# Patient Record
Sex: Male | Born: 1995 | Hispanic: Yes | Marital: Single | Smoking: Never smoker
Health system: Southern US, Community
[De-identification: ages and names within clinical notes are randomized; demographics above are authoritative.]

---
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
## 2019-12-25 ENCOUNTER — Emergency Department (HOSPITAL_COMMUNITY): Payer: Self-pay

## 2019-12-25 ENCOUNTER — Encounter (HOSPITAL_COMMUNITY): Payer: Self-pay | Admitting: *Deleted

## 2019-12-25 ENCOUNTER — Other Ambulatory Visit: Payer: Self-pay

## 2019-12-25 ENCOUNTER — Emergency Department (HOSPITAL_COMMUNITY)
Admission: EM | Admit: 2019-12-25 | Discharge: 2019-12-25 | Disposition: A | Discharge status: Home / Self Care | Payer: Self-pay | Attending: Emergency Medicine | Admitting: Emergency Medicine

## 2019-12-25 DIAGNOSIS — M25532 Pain in left wrist: Secondary | ICD-10-CM | POA: Insufficient documentation

## 2019-12-25 DIAGNOSIS — M25542 Pain in joints of left hand: Secondary | ICD-10-CM | POA: Insufficient documentation

## 2019-12-25 DIAGNOSIS — M79641 Pain in right hand: Secondary | ICD-10-CM

## 2019-12-25 DIAGNOSIS — Y9389 Activity, other specified: Secondary | ICD-10-CM | POA: Insufficient documentation

## 2019-12-25 DIAGNOSIS — Y9289 Other specified places as the place of occurrence of the external cause: Secondary | ICD-10-CM | POA: Insufficient documentation

## 2019-12-25 DIAGNOSIS — M25541 Pain in joints of right hand: Secondary | ICD-10-CM | POA: Insufficient documentation

## 2019-12-25 DIAGNOSIS — M25531 Pain in right wrist: Secondary | ICD-10-CM | POA: Insufficient documentation

## 2019-12-25 DIAGNOSIS — W228XXA Striking against or struck by other objects, initial encounter: Secondary | ICD-10-CM | POA: Insufficient documentation

## 2019-12-25 DIAGNOSIS — Y999 Unspecified external cause status: Secondary | ICD-10-CM | POA: Insufficient documentation

## 2019-12-25 MED ORDER — NAPROXEN 500 MG PO TABS: 500.0000 mg | ORAL_TABLET | Freq: Two times a day (BID) | ORAL | 0 refills | Status: DC | PRN

## 2019-12-25 MED ORDER — NAPROXEN 250 MG PO TABS
500.0000 mg | ORAL_TABLET | Freq: Once | ORAL | Status: AC
  Administered 2019-12-25: 500 mg via ORAL
  Filled 2019-12-25: qty 2

## 2019-12-25 MED ORDER — ACETAMINOPHEN 325 MG PO TABS
650.0000 mg | ORAL_TABLET | Freq: Once | ORAL | Status: DC
  Filled 2019-12-25: qty 2

## 2019-12-25 NOTE — Discharge Instructions (Signed)
Please read and follow all provided instructions.  You have been seen today for pain to your wrists and hands on both sides.  Tests performed today include: An x-ray of the affected areas - does NOT show any broken bones or dislocations.  Vital signs. See below for your results today.   Home care instructions: -- *PRICE in the first 24-48 hours after injury: Protect (with brace, splint, sling), if given by your provider Rest Ice- Do not apply ice pack directly to your skin, place towel or similar between your skin and ice/ice pack. Apply ice for 20 min, then remove for 40 min while awake Compression- Wear brace, elastic bandage, splint as directed by your provider Elevate affected extremity above the level of your heart when not walking around for the first 24-48 hours   Please wear the brace on your right hand at all times due to concern that you may have an injury to a bone that was not fracture does not wish upon initial imaging.  Please wear the brace until you have followed up with orthopedics.  Medications:  - Naproxen is a nonsteroidal anti-inflammatory medication that will help with pain and swelling. Be sure to take this medication as prescribed with food, 1 pill every 12 hours,  It should be taken with food, as it can cause stomach upset, and more seriously, stomach bleeding. Do not take other nonsteroidal anti-inflammatory medications with this such as Advil, Motrin, Aleve, Mobic, Goodie Powder, or Motrin.    You make take Tylenol per over the counter dosing with these medications.   We have prescribed you new medication(s) today. Discuss the medications prescribed today with your pharmacist as they can have adverse effects and interactions with your other medicines including over the counter and prescribed medications. Seek medical evaluation if you start to experience new or abnormal symptoms after taking one of these medicines, seek care immediately if you start to experience  difficulty breathing, feeling of your throat closing, facial swelling, or rash as these could be indications of a more serious allergic reaction   Follow-up instructions: Please follow-up with orthopedic surgery within 3 days.  Return instructions:  Please return if your digits or extremity are numb or tingling, appear gray or blue, or you have severe pain (also elevate the extremity and loosen splint or wrap if you were given one) Please return if you have redness or fevers.  Please return to the Emergency Department if you experience worsening symptoms.  Please return if you have any other emergent concerns. Additional Information:  Your vital signs today were: BP 116/71 (BP Location: Left Arm)   Pulse 61   Temp 97.6 F (36.4 C) (Oral)   Resp 14   Ht 5\' 7"  (1.702 m)   Wt 72.6 kg   SpO2 100%   BMI 25.06 kg/m  If your blood pressure (BP) was elevated above 135/85 this visit, please have this repeated by your doctor within one month. ---------------

## 2019-12-25 NOTE — ED Triage Notes (Addendum)
Pt with right wrist pain worse than the left wrist for about a week.  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

## 2019-12-25 NOTE — ED Notes (Signed)
Discharge vitals not obtained. Patient had to get back home to his child.

## 2019-12-25 NOTE — ED Provider Notes (Signed)
[facility] EMERGENCY DEPARTMENT Provider Note   CSN: [id number] Arrival date & time: 12/25/19  1621     History Chief Complaint  Patient presents with  . Wrist Pain    Dakota Lynn is a 24 y.o. male without significant past medical history who presents to the emergency department with complaints of bilateral wrist and hand pain for about 1 week.  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  He remains with some discomfort in the left wrist/hand as well.  Pain is worse with movement.  No alleviating factors.  Tried ibuprofen without much relief.  Denies numbness, tingling, weakness, redness, or open wounds.  He is right-hand dominant.  HPI     History reviewed. No pertinent past medical history.  There are no problems to display for this patient.   History reviewed. No pertinent surgical history.     History reviewed. No pertinent family history.  Social History   Tobacco Use  . Smoking status: Never Smoker  . Smokeless tobacco: Never Used  Substance Use Topics  . Alcohol use: Never  . Drug use: Never    Home Medications Prior to Admission medications   Not on File    Allergies    Patient has no known allergies.  Review of Systems   Review of Systems  Constitutional: Negative for chills and fever.  Respiratory: Negative for shortness of breath.   Cardiovascular: Negative for chest pain.  Gastrointestinal: Negative for abdominal pain and vomiting.  Musculoskeletal: Positive for arthralgias.  Skin: Negative for color change and wound.  Neurological: Negative for weakness and numbness.    Physical Exam Updated Vital Signs BP 116/71 (BP Location: Left Arm)   Pulse 61   Temp 97.6 F (36.4 C) (Oral)   Resp 14   Ht 5\' 7"  (1.702 m)   Wt 72.6 kg   SpO2 100%   BMI 25.06 kg/m   Physical Exam Vitals and nursing note reviewed.    Constitutional:      General: He is not in acute distress.    Appearance: Normal appearance. He is not ill-appearing or toxic-appearing.  HENT:     Head: Normocephalic and atraumatic.  Neck:     Comments: No midline tenderness.  Cardiovascular:     Rate and Rhythm: Normal rate.     Pulses:          Radial pulses are 2+ on the right side and 2+ on the left side.  Pulmonary:     Effort: No respiratory distress.     Breath sounds: Normal breath sounds.  Musculoskeletal:     Cervical back: Normal range of motion and neck supple.     Comments: Upper extremities: No obvious deformity, appreciable swelling, edema, erythema, ecchymosis, warmth, or open wounds. Patient has intact AROM throughout. RUE: Patient is tender to the diffuse R wrist more so radially extending to the 1st MCP, does have some anatomical snuffbox tenderness but this is not primary area of tenderness. Pain with finkelstein maneuver. Otherwise nontender. LUE: Tender to the radial wrist, otherwise no significant tenderness to palpation. Pain with finkelstein maneuver.   Skin:    General: Skin is warm and dry.     Capillary Refill: Capillary refill takes less than 2 seconds.  Neurological:     Mental Status: He is alert.     Comments: Alert.  Clear speech. Sensation grossly intact to bilateral upper extremities. 5/5 symmetric grip strength. Ambulatory. Able to perform OK sign, thumbs up and cross 2nd/3rd digits bilaterally.   Psychiatric:        Mood and Affect: Mood normal.        Behavior: Behavior normal.    ED Results / Procedures / Treatments   Labs (all labs ordered are listed, but only abnormal results are displayed) Labs Reviewed - No data to display  EKG None  Radiology DG Hand Complete Left  Result Date: 12/25/2019 CLINICAL DATA:  Bilateral hand pain. EXAM: LEFT HAND - COMPLETE 3+ VIEW COMPARISON:  None. FINDINGS: There is no evidence of fracture or dislocation. There is no evidence of arthropathy or other  focal bone abnormality. Soft tissues are unremarkable. IMPRESSION: Negative. Electronically Signed   By: Aram Candela M.D.   On: 12/25/2019 19:14   DG Hand Complete Right  Result Date: 12/25/2019 CLINICAL DATA:  Bilateral hand pain. EXAM: RIGHT HAND - COMPLETE 3+ VIEW COMPARISON:  None. FINDINGS: There is no evidence of fracture or dislocation. There is no evidence of arthropathy or other focal bone abnormality. Soft tissues are unremarkable. IMPRESSION: Negative. Electronically Signed   By: Aram Candela M.D.   On: 12/25/2019 19:14    Procedures Procedures (including critical care time)  Medications Ordered in ED Medications  acetaminophen (TYLENOL) tablet 650 mg (has no administration in time range)    ED Course  I have reviewed the triage vital signs and the nursing notes.  Pertinent labs & imaging results that were available during my care of the patient were reviewed by me and considered in my medical decision making (see chart for details).    MDM Rules/Calculators/A&P                         Patient presents to the emergency department with complaints of bilateral wrist/hand pain in the setting of working with his hands with worsening pain to the right wrist/hand after hitting it with a tile a few days ago.  Patient is nontoxic, resting comfortably, vitals within normal limits.  He is afebrile, there is no erythema, warmth, or open wounds, overall presentation does not seem consistent with septic joint, infectious flexor tenosynovitis, or cellulitis.  I ordered X-rays of the bilateral hands with extension to the wrist, no acute process identified, I have personally reviewed & interpreted imaging.  No fractures or dislocations.  He does have some anatomical snuffbox tenderness to the right upper extremity, however this does not seem to be his most focal area of tenderness, we will place in a thumb spica brace with instructions to maintain this until he is able to follow-up with  orthopedics for precautions.  He does have some discomfort with Finkelstein maneuver bilaterally, I suspect this is probably more of a tendinitis.  Will start on naproxen, discussed PRICE, and orthopedics follow-up. I discussed results, treatment plan, need for follow-up, and return precautions with the patient. Provided opportunity for questions, patient confirmed understanding and is in agreement with plan.   Final Clinical Impression(s) / ED Diagnoses Final diagnoses:  Pain in both wrists  Bilateral hand pain    Rx / DC Orders ED Discharge Orders         Ordered    naproxen (NAPROSYN) 500 MG tablet  2 times daily PRN     Discontinue  Reprint     12/25/19 1935  Desmond Lope 12/25/19 Ninfa Linden    Raeford Razor, MD 12/30/19 2043

## 2020-01-02 ENCOUNTER — Telehealth: Payer: Self-pay | Admitting: Orthopedic Surgery

## 2020-01-02 NOTE — Telephone Encounter (Signed)
This patient called the office late today (4:55) asking for an appointment for tomorrow before noon before he goes to [place], [place].  I told him that Dr Romeo Apple did not have any openings for tomorrow morning.  He said he has wrist pain and went to the ED.  He said he would just call us back in 2-3 weeks when he came back from [place] to schedule an appointment.

## 2021-05-03 IMAGING — DX DG HAND COMPLETE 3+V*R*
3 series · 3 of 3 positions shown · non-contrast
Comparison: None.

CLINICAL DATA: Bilateral hand pain.

EXAM:
RIGHT HAND - COMPLETE 3+ VIEW

[hand pa]
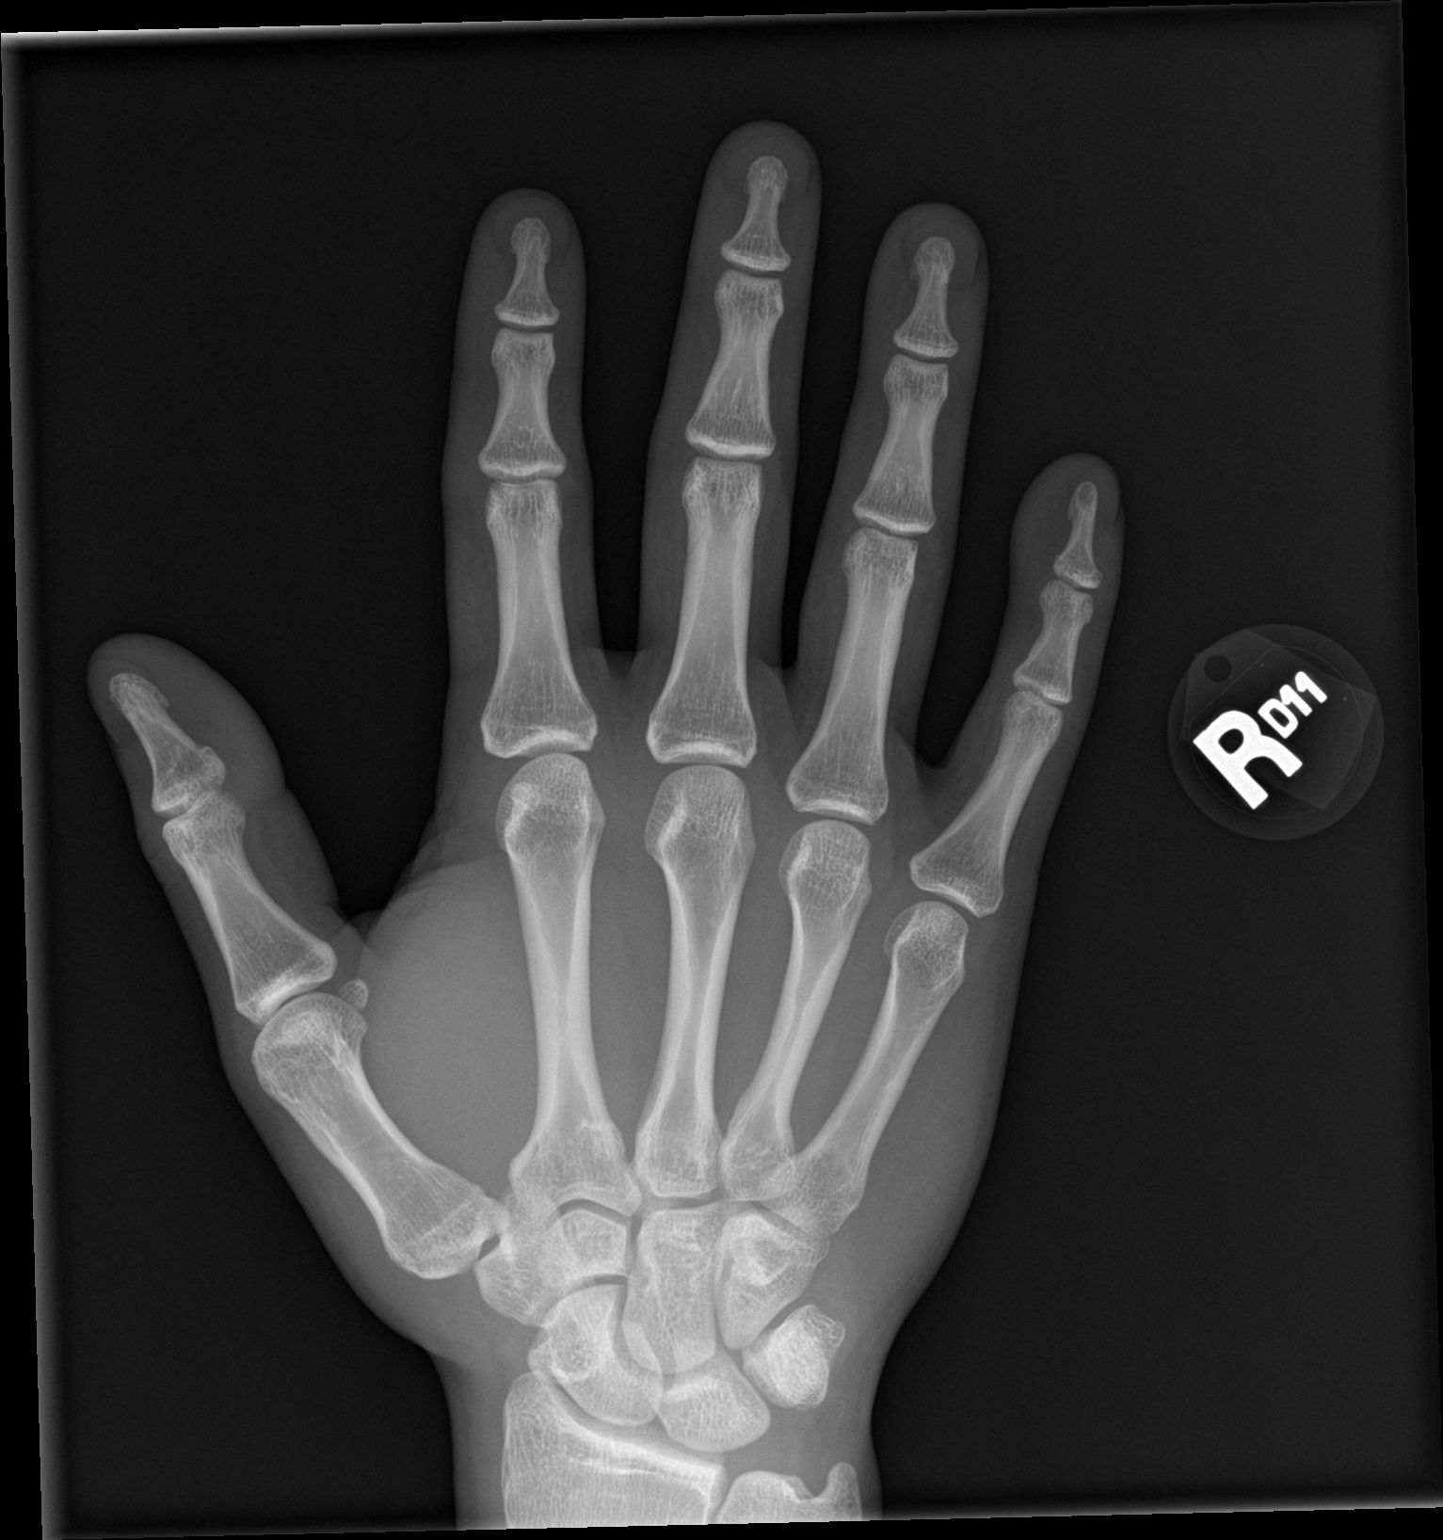

[hand obl]
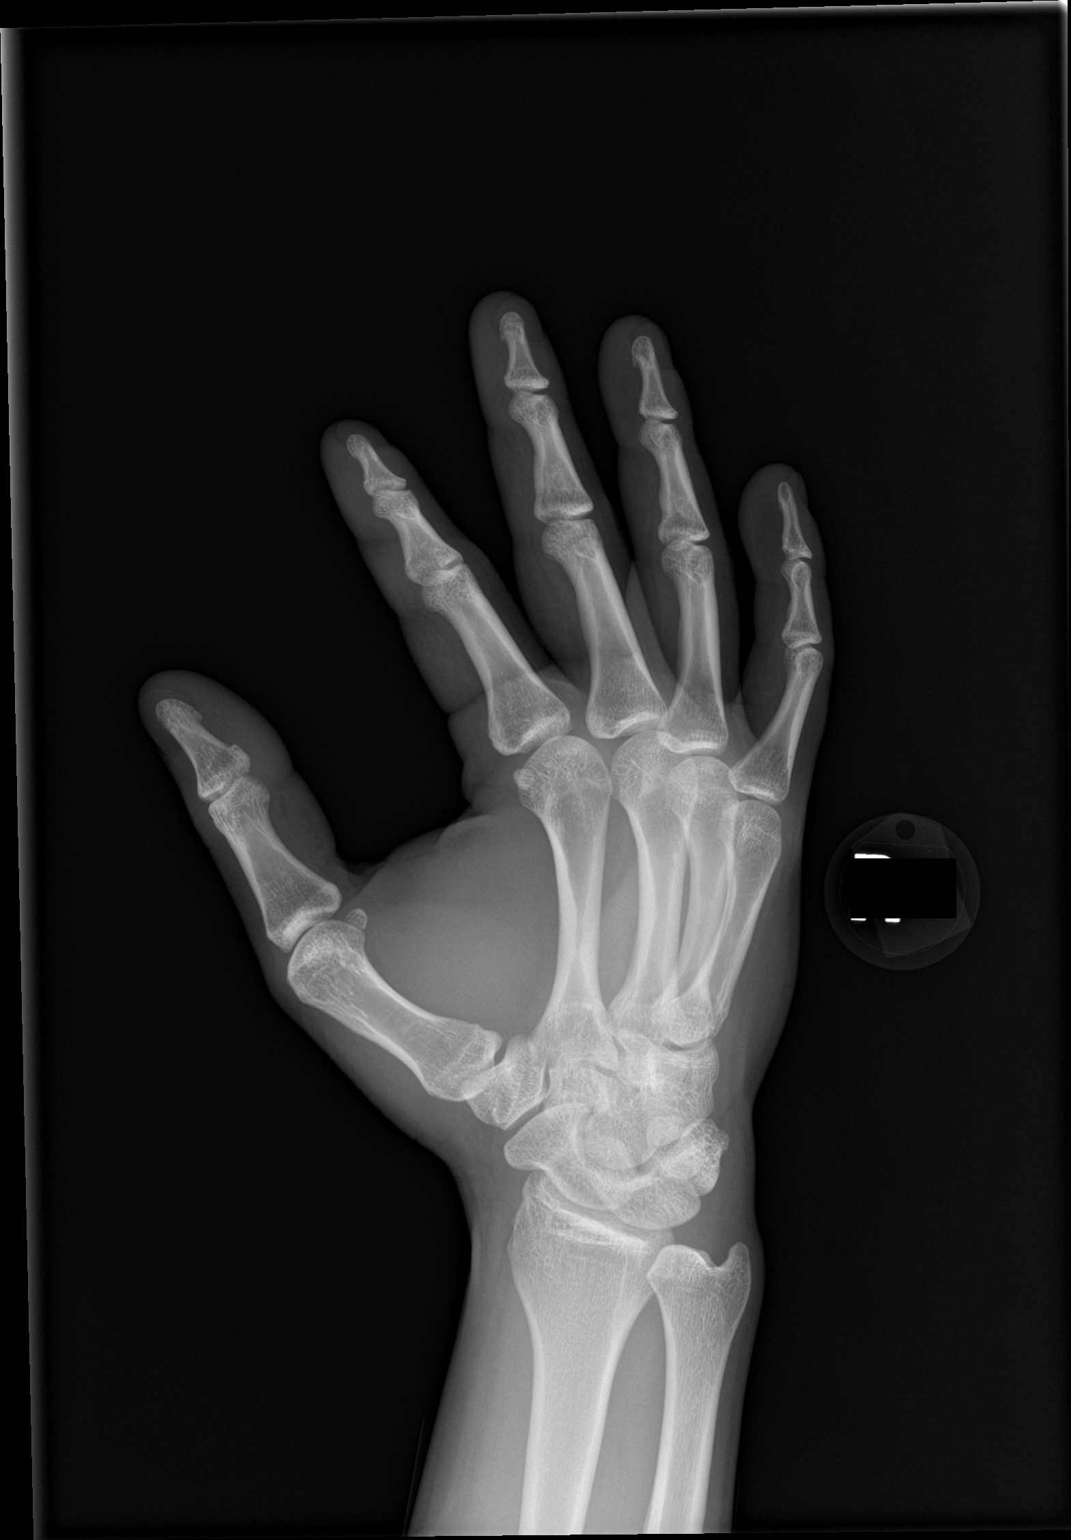

[hand lat]
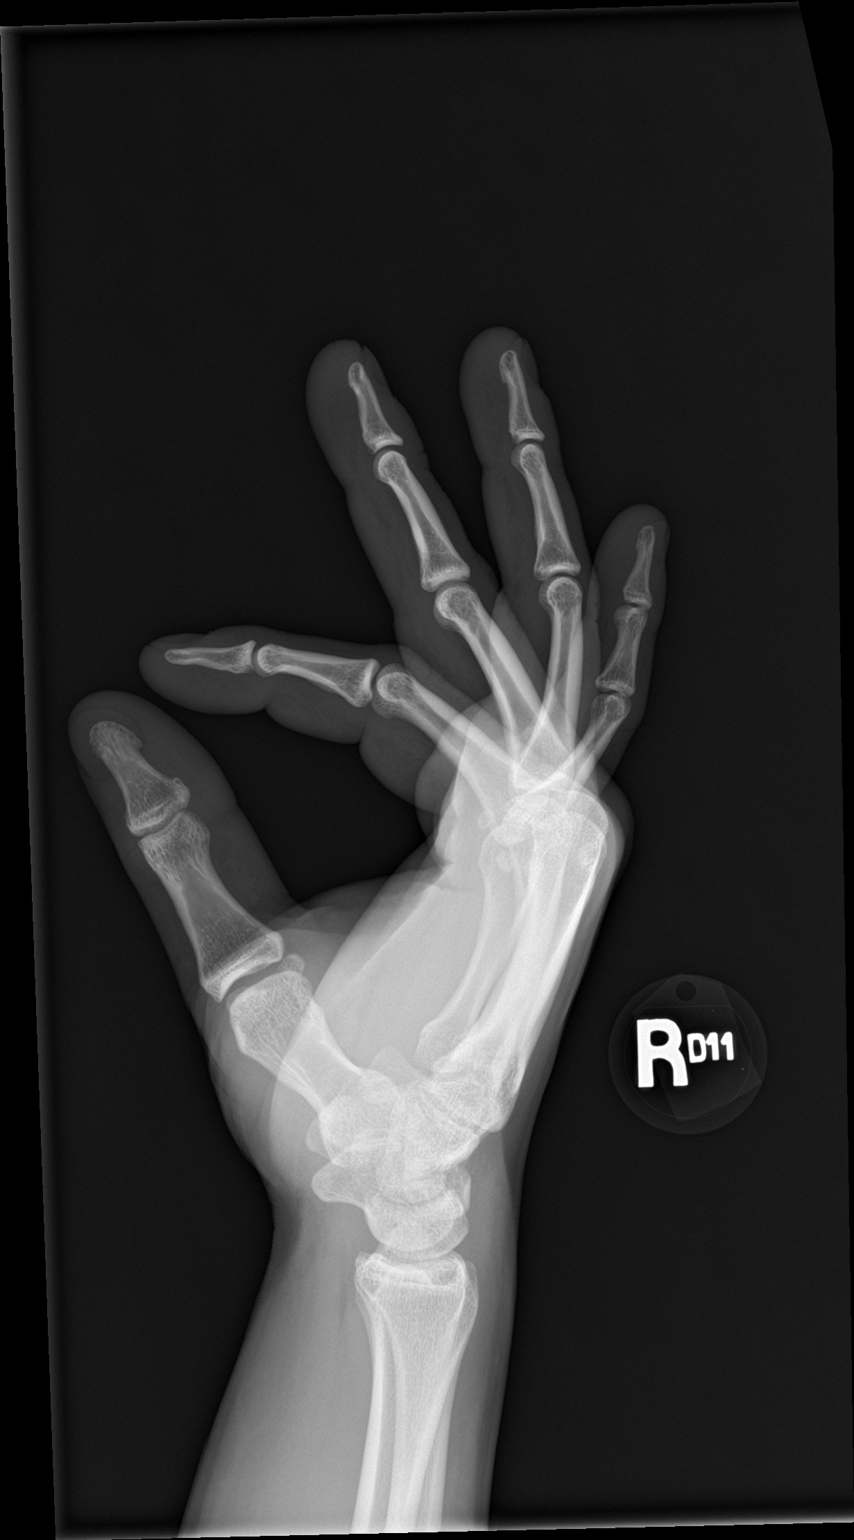

[3 of 3 positions shown; findings below may reference images not displayed]

FINDINGS: There is no evidence of fracture or dislocation. There is no
evidence of arthropathy or other focal bone abnormality. Soft
tissues are unremarkable.
IMPRESSION: Negative.

## 2021-05-03 IMAGING — DX DG HAND COMPLETE 3+V*L*
3 series · 3 of 3 positions shown · non-contrast
Comparison: None.

CLINICAL DATA: Bilateral hand pain.

EXAM:
LEFT HAND - COMPLETE 3+ VIEW

[hand pa]
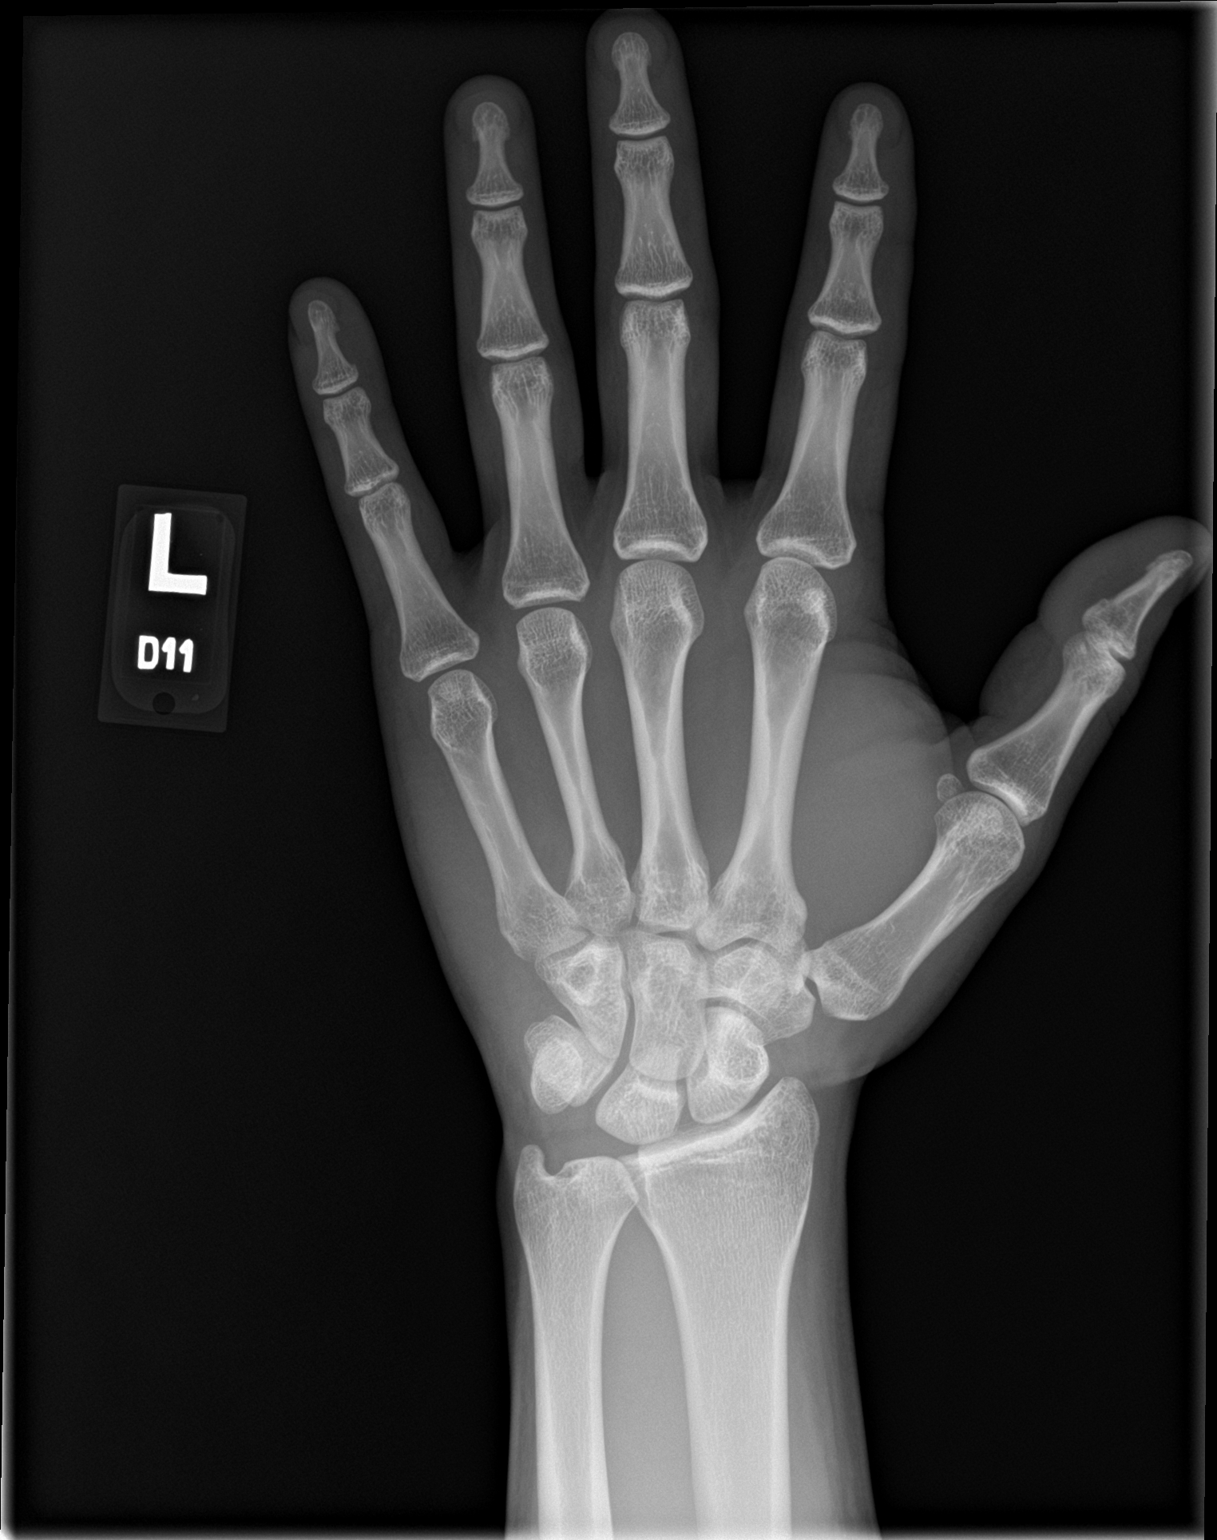

[hand obl]
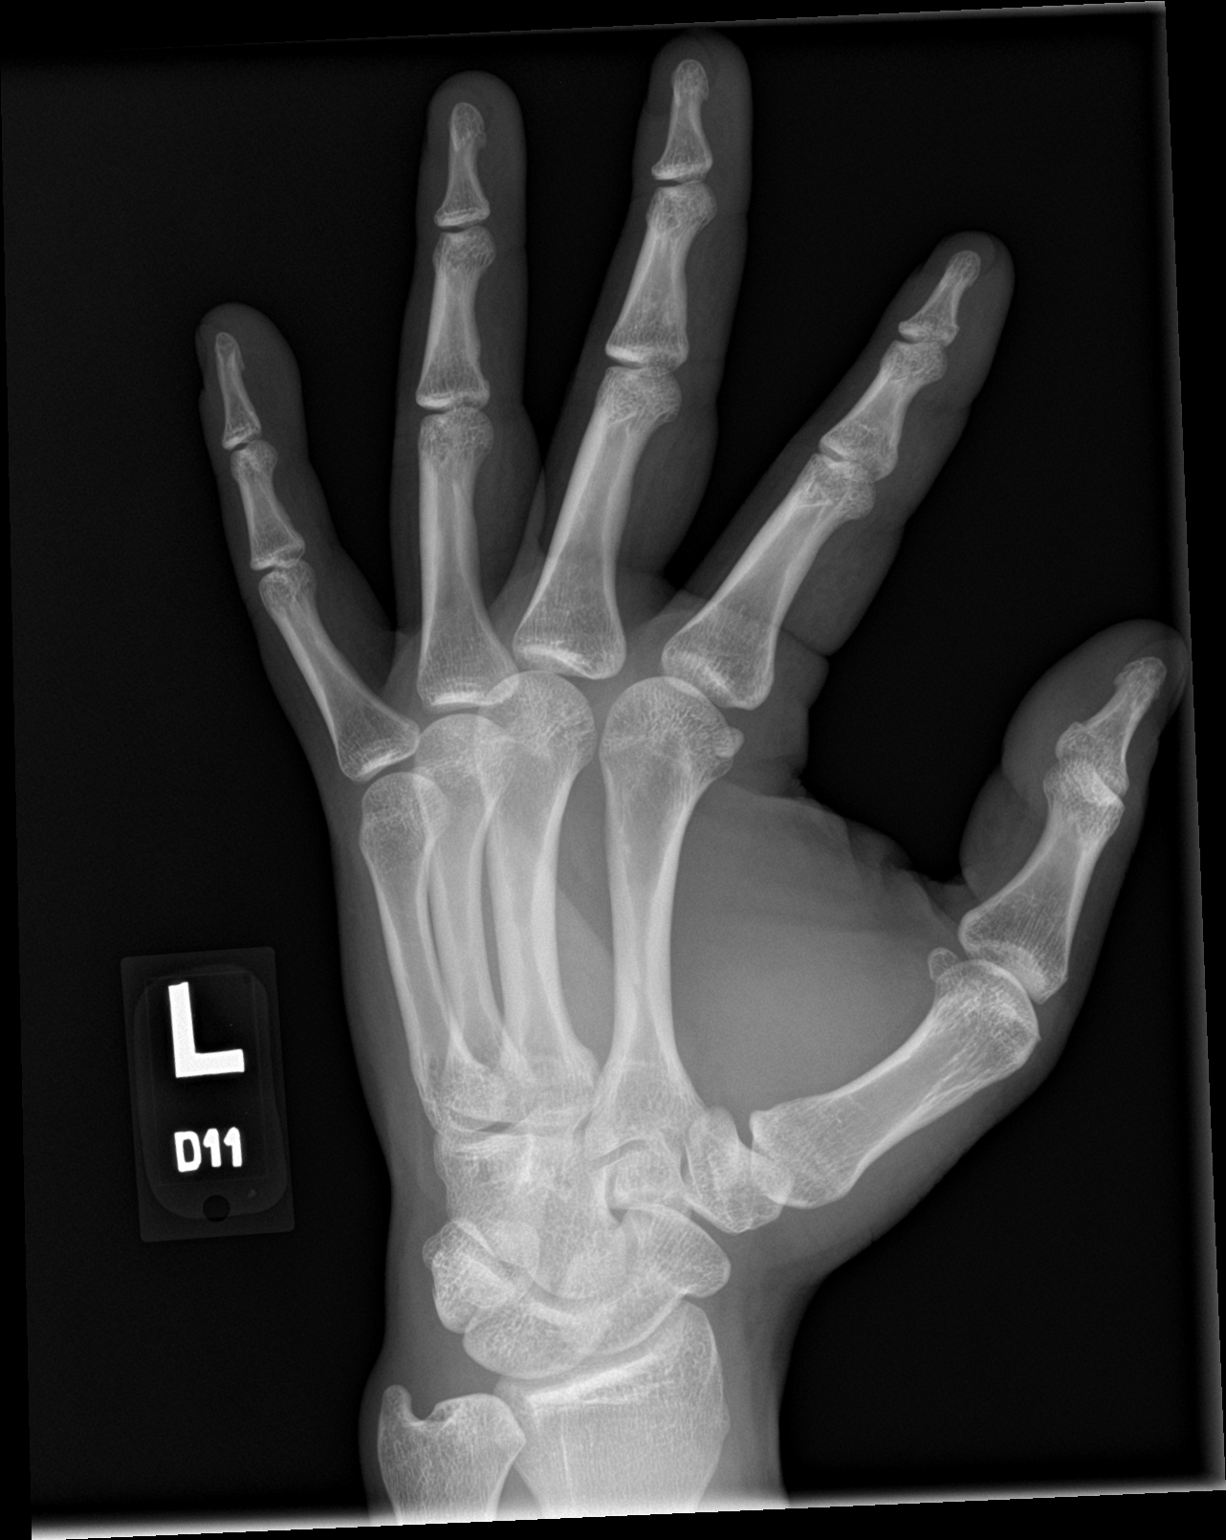

[hand lat]
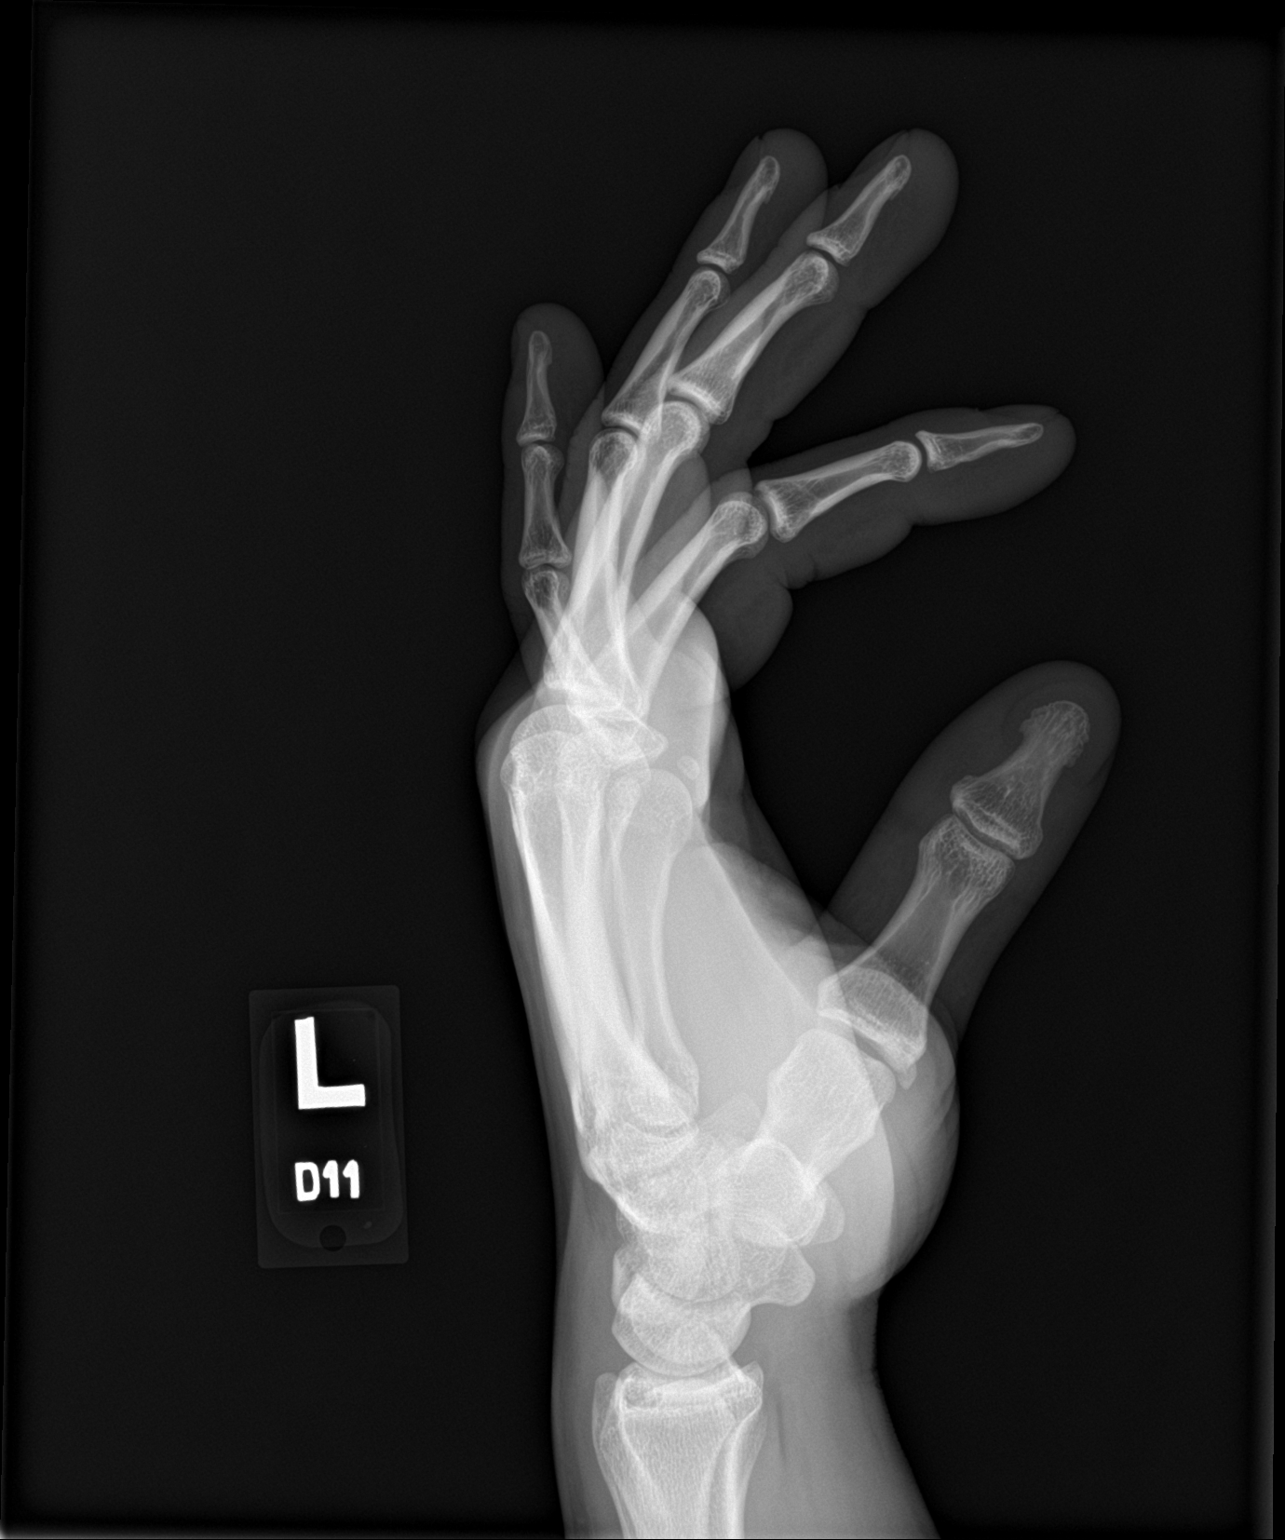

[3 of 3 positions shown; findings below may reference images not displayed]

FINDINGS: There is no evidence of fracture or dislocation. There is no
evidence of arthropathy or other focal bone abnormality. Soft
tissues are unremarkable.
IMPRESSION: Negative.

## 2022-01-13 ENCOUNTER — Encounter (HOSPITAL_BASED_OUTPATIENT_CLINIC_OR_DEPARTMENT_OTHER): Payer: Self-pay | Admitting: Emergency Medicine

## 2022-01-13 ENCOUNTER — Emergency Department (HOSPITAL_BASED_OUTPATIENT_CLINIC_OR_DEPARTMENT_OTHER): Payer: Self-pay

## 2022-01-13 ENCOUNTER — Emergency Department (HOSPITAL_BASED_OUTPATIENT_CLINIC_OR_DEPARTMENT_OTHER)
Admission: EM | Admit: 2022-01-13 | Discharge: 2022-01-14 | Disposition: A | Discharge status: Home / Self Care | Payer: Self-pay | Attending: Emergency Medicine | Admitting: Emergency Medicine

## 2022-01-13 ENCOUNTER — Other Ambulatory Visit: Payer: Self-pay

## 2022-01-13 DIAGNOSIS — Z23 Encounter for immunization: Secondary | ICD-10-CM | POA: Insufficient documentation

## 2022-01-13 DIAGNOSIS — M79642 Pain in left hand: Secondary | ICD-10-CM

## 2022-01-13 MED ORDER — TETANUS-DIPHTH-ACELL PERTUSSIS 5-2.5-18.5 LF-MCG/0.5 IM SUSY
0.5000 mL | PREFILLED_SYRINGE | Freq: Once | INTRAMUSCULAR | Status: AC
  Administered 2022-01-13: 0.5 mL via INTRAMUSCULAR
  Filled 2022-01-13: qty 0.5

## 2022-01-13 MED ORDER — DOXYCYCLINE HYCLATE 100 MG PO TABS
100.0000 mg | ORAL_TABLET | Freq: Once | ORAL | Status: AC
  Administered 2022-01-13: 100 mg via ORAL
  Filled 2022-01-13: qty 1

## 2022-01-13 MED ORDER — HYDROCODONE-ACETAMINOPHEN 5-325 MG PO TABS
1.0000 | ORAL_TABLET | Freq: Once | ORAL | Status: AC
  Administered 2022-01-13: 1 via ORAL
  Filled 2022-01-13: qty 1

## 2022-01-13 NOTE — ED Triage Notes (Signed)
While at work yesterday pt noticed blood coming from left hand but unsure what it was. Today has a bump and feels like there is something under skin where blood was coming from. Pt states when it happened he was working with wood and nail so it could be either. Pain is worse today and difficult to move hand.

## 2022-01-14 MED ORDER — HYDROCODONE-ACETAMINOPHEN 5-325 MG PO TABS: 1.0000 | ORAL_TABLET | Freq: Four times a day (QID) | ORAL | 0 refills | Status: DC | PRN

## 2022-01-14 MED ORDER — DOXYCYCLINE HYCLATE 100 MG PO CAPS: 100.0000 mg | ORAL_CAPSULE | Freq: Two times a day (BID) | ORAL | 0 refills | Status: DC

## 2022-01-14 NOTE — ED Provider Notes (Signed)
[facility] EMERGENCY DEPARTMENT Provider Note   CSN: [id number] Arrival date & time: 01/13/22  1953     History  Chief Complaint  Patient presents with   Hand Pain    Dakota Lynn is a 26 y.o. male.  Patient with an injury at work yesterday patient had a puncture wound to his right hand kind of over the index MP joint area.  It bled a lot.  He was concerned that maybe a piece of wood got in there a piece of metal because the wood had nails in it that he was removing and cutting through.  Patient states his hand was more swollen today.  Has good sensation to his fingers.  But has some pain with range of motion.  Tetanus is not up-to-date.  Patient is right-hand dominant  Past medical history noncontributory.  Patient not a smoker.       Home Medications Prior to Admission medications   Medication Sig Start Date End Date Taking? Authorizing Provider  doxycycline (VIBRAMYCIN) 100 MG capsule Take 1 capsule (100 mg total) by mouth 2 (two) times daily. 01/14/22  Yes Fredia Sorrow, MD  HYDROcodone-acetaminophen (NORCO/VICODIN) 5-325 MG tablet Take 1 tablet by mouth every 6 (six) hours as needed for moderate pain. 01/14/22  Yes Fredia Sorrow, MD  naproxen (NAPROSYN) 500 MG tablet Take 1 tablet (500 mg total) by mouth 2 (two) times daily as needed for moderate pain. 12/25/19   Petrucelli, Glynda Jaeger, PA-C      Allergies    Patient has no known allergies.    Review of Systems   Review of Systems  Constitutional:  Negative for chills and fever.  HENT:  Negative for ear pain and sore throat.   Eyes:  Negative for pain and visual disturbance.  Respiratory:  Negative for cough and shortness of breath.   Cardiovascular:  Negative for chest pain and palpitations.  Gastrointestinal:  Negative for abdominal pain and vomiting.  Genitourinary:  Negative for dysuria and hematuria.  Musculoskeletal:  Positive for joint swelling. Negative for arthralgias and back pain.  Skin:  Negative  for color change and rash.  Neurological:  Negative for seizures and syncope.  All other systems reviewed and are negative.   Physical Exam Updated Vital Signs BP 125/77 (BP Location: Right Arm)   Pulse 65   Temp 98.7 F (37.1 C) (Oral)   Resp 18   Ht 1.702 m (5\' 7" )   Wt 81.6 kg   SpO2 100%   BMI 28.19 kg/m  Physical Exam Vitals and nursing note reviewed.  Constitutional:      General: He is not in acute distress.    Appearance: Normal appearance. He is well-developed.  HENT:     Head: Normocephalic and atraumatic.  Eyes:     Extraocular Movements: Extraocular movements intact.     Conjunctiva/sclera: Conjunctivae normal.     Pupils: Pupils are equal, round, and reactive to light.  Cardiovascular:     Rate and Rhythm: Normal rate and regular rhythm.     Heart sounds: No murmur heard. Pulmonary:     Effort: Pulmonary effort is normal. No respiratory distress.     Breath sounds: Normal breath sounds.  Abdominal:     Palpations: Abdomen is soft.     Tenderness: There is no abdominal tenderness.  Musculoskeletal:        General: Swelling, tenderness and signs of injury present. No deformity.     Cervical back: Neck supple.  Comments: Left hand kind of at the base of the index finger.  You can palpate the foreign body seen on x-ray.  There is some slight swelling to the hand no significant pain with range of motion cap refill distally is very intact.  There is a closed puncture wound that is a little bit distal from where the foreign body palpable area is.  Noticed significant swelling to the back of the hand or to the palm.  Sensation is intact.  Skin:    General: Skin is warm and dry.     Capillary Refill: Capillary refill takes less than 2 seconds.  Neurological:     General: No focal deficit present.     Mental Status: He is alert and oriented to person, place, and time.     Cranial Nerves: No cranial nerve deficit.     Sensory: No sensory deficit.     Motor: No  weakness.  Psychiatric:        Mood and Affect: Mood normal.     ED Results / Procedures / Treatments   Labs (all labs ordered are listed, but only abnormal results are displayed) Labs Reviewed - No data to display  EKG None  Radiology DG Hand Complete Left  Result Date: 01/13/2022 CLINICAL DATA:  Left hand injury EXAM: LEFT HAND - COMPLETE 3+ VIEW COMPARISON:  12/25/2019 FINDINGS: There is no evidence of fracture or dislocation. There is no evidence of arthropathy or other focal bone abnormality. 3 mm metallic retained radiopaque foreign body is seen within the soft tissues dorsal to the base of the second proximal phalanx. Soft tissues are otherwise unremarkable. IMPRESSION: 3 mm retained metallic foreign body within the soft tissues dorsal to the base of the second proximal phalanx. Electronically Signed   By: Helyn Numbers M.D.   On: 01/13/2022 20:41    Procedures Procedures    Medications Ordered in ED Medications  Tdap (BOOSTRIX) injection 0.5 mL (0.5 mLs Intramuscular Given 01/13/22 2258)  doxycycline (VIBRA-TABS) tablet 100 mg (100 mg Oral Given 01/13/22 2328)  HYDROcodone-acetaminophen (NORCO/VICODIN) 5-325 MG per tablet 1 tablet (1 tablet Oral Given 01/13/22 2328)    ED Course/ Medical Decision Making/ A&P                           Medical Decision Making Amount and/or Complexity of Data Reviewed Radiology: ordered.  Risk Prescription drug management.   X-ray shows a 3 mm retained metallic foreign body within the soft tissues dorsal to the base of the second proximal phalanx.  Clinically no signs of infection.  There is a puncture wound but it is not near where the foreign body is palpable.  No purulent discharge.  No evidence of any tenosynovitis.  Neurovascularly intact distally.  Patient's tetanus updated patient treated here with doxycycline to continue that for the next 7 days.  Patient will take nonsteroidal medicine and also given a prescription for  hydrocodone.  He will elevate the hand is much as possible.   He will soak it in warm water for 20 minutes daily.  Work note provided.  And he will make an appointment to follow-up with hand surgery to see if they want to remove the metallic foreign body which is not easily accessible. Final Clinical Impression(s) / ED Diagnoses Final diagnoses:  Left hand pain    Rx / DC Orders ED Discharge Orders          Ordered  HYDROcodone-acetaminophen (NORCO/VICODIN) 5-325 MG tablet  Every 6 hours PRN        01/14/22 0008    doxycycline (VIBRAMYCIN) 100 MG capsule  2 times daily        01/14/22 0008              Vanetta Mulders, MD 01/14/22 0015

## 2022-01-14 NOTE — Discharge Instructions (Signed)
Take the antibiotic doxycycline as directed for the next 7 days.  Take the hydrocodone as needed for pain.  Also would recommend taking Aleve which is Naprosyn 1 or 2 tablets every 12 hours.  We will elevate the left hand is much as possible.  Make an appointment to follow-up with hand surgery to see if they want to remove the 3 mm metallic body.  Tetanus was updated here today.

## 2022-08-24 ENCOUNTER — Emergency Department (HOSPITAL_BASED_OUTPATIENT_CLINIC_OR_DEPARTMENT_OTHER): Payer: Self-pay

## 2022-08-24 ENCOUNTER — Emergency Department (HOSPITAL_BASED_OUTPATIENT_CLINIC_OR_DEPARTMENT_OTHER)
Admission: EM | Admit: 2022-08-24 | Discharge: 2022-08-25 | Disposition: A | Discharge status: Home / Self Care | Payer: Self-pay | Attending: Emergency Medicine | Admitting: Emergency Medicine

## 2022-08-24 ENCOUNTER — Other Ambulatory Visit: Payer: Self-pay

## 2022-08-24 DIAGNOSIS — M545 Low back pain, unspecified: Secondary | ICD-10-CM | POA: Insufficient documentation

## 2022-08-24 MED ORDER — OXYCODONE-ACETAMINOPHEN 5-325 MG PO TABS
1.0000 | ORAL_TABLET | ORAL | Status: DC | PRN
  Administered 2022-08-24: 1 via ORAL
  Filled 2022-08-24: qty 1

## 2022-08-24 NOTE — ED Triage Notes (Signed)
Pt here for sharp lower back pain that started this morning after he bent over to pick something up. Pt states he has been laying flat all day, and has tried [employer] w/o relief. Pt reports pain is now starting to radiate down L leg.

## 2022-08-25 MED ORDER — METHOCARBAMOL 500 MG PO TABS: 500.0000 mg | ORAL_TABLET | Freq: Two times a day (BID) | ORAL | 0 refills | Status: AC

## 2022-08-25 MED ORDER — METHOCARBAMOL 500 MG PO TABS
500.0000 mg | ORAL_TABLET | Freq: Once | ORAL | Status: AC
  Administered 2022-08-25: 500 mg via ORAL
  Filled 2022-08-25: qty 1

## 2022-08-25 MED ORDER — NAPROXEN 500 MG PO TABS: 500.0000 mg | ORAL_TABLET | Freq: Two times a day (BID) | ORAL | 0 refills | Status: AC | PRN

## 2022-08-25 MED ORDER — NAPROXEN 500 MG PO TABS: 500.0000 mg | ORAL_TABLET | Freq: Two times a day (BID) | ORAL | 0 refills | Status: DC | PRN

## 2022-08-25 MED ORDER — KETOROLAC TROMETHAMINE 30 MG/ML IJ SOLN
30.0000 mg | Freq: Once | INTRAMUSCULAR | Status: AC
  Administered 2022-08-25: 30 mg via INTRAMUSCULAR
  Filled 2022-08-25: qty 1

## 2022-08-25 NOTE — ED Provider Notes (Signed)
[facility]  Provider Note  CSN: [id number] Arrival date & time: 08/24/22 2215  History Chief Complaint  Patient presents with   Back Pain    Dakota Lynn is a 27 y.o. male with no significant PMH reports onset of sharp, mid lower back pain earlier today after bending over to pick up something. He reports he took some aleve and has been lying flat all day without improvement. Pain briefly radiated to L leg, but not consistently so. No incontinence, no IVDU or fever.    Home Medications Prior to Admission medications   Medication Sig Start Date End Date Taking? Authorizing Provider  methocarbamol (ROBAXIN) 500 MG tablet Take 1 tablet (500 mg total) by mouth 2 (two) times daily. 08/25/22  Yes Truddie Hidden, MD  naproxen (NAPROSYN) 500 MG tablet Take 1 tablet (500 mg total) by mouth 2 (two) times daily as needed for moderate pain. 08/25/22   Truddie Hidden, MD     Allergies    Patient has no known allergies.   Review of Systems   Review of Systems Please see HPI for pertinent positives and negatives  Physical Exam BP 124/66 (BP Location: Left Arm)   Pulse 65   Temp 99.2 F (37.3 C)   Resp 18   SpO2 100%   Physical Exam Vitals and nursing note reviewed.  Constitutional:      Appearance: Normal appearance.  HENT:     Head: Normocephalic and atraumatic.     Nose: Nose normal.     Mouth/Throat:     Mouth: Mucous membranes are moist.  Eyes:     Extraocular Movements: Extraocular movements intact.     Conjunctiva/sclera: Conjunctivae normal.  Cardiovascular:     Rate and Rhythm: Normal rate.  Pulmonary:     Effort: Pulmonary effort is normal.     Breath sounds: Normal breath sounds.  Abdominal:     General: Abdomen is flat.     Palpations: Abdomen is soft.     Tenderness: There is no abdominal tenderness.  Musculoskeletal:        General: Tenderness (midline L spine) present. No swelling. Normal range of motion.      Cervical back: Neck supple.  Skin:    General: Skin is warm and dry.  Neurological:     General: No focal deficit present.     Mental Status: He is alert.     Cranial Nerves: No cranial nerve deficit.     Sensory: No sensory deficit.     Motor: No weakness.     Deep Tendon Reflexes: Reflexes normal.     Comments: Neg straight leg raise  Psychiatric:        Mood and Affect: Mood normal.     ED Results / Procedures / Treatments   EKG None  Procedures Procedures  Medications Ordered in the ED Medications  oxyCODONE-acetaminophen (PERCOCET/ROXICET) 5-325 MG per tablet 1 tablet (1 tablet Oral Given 08/24/22 2223)  ketorolac (TORADOL) 30 MG/ML injection 30 mg (has no administration in time range)  methocarbamol (ROBAXIN) tablet 500 mg (has no administration in time range)    Initial Impression and Plan  Patient here with MSK low back pain. Had xray done in triage. I personally viewed the images from radiology studies and agree with radiologist interpretation: Xray neg for acute process. Pain improved some with oxycodone given in triage. Will give IM toradol, Robaxin and plan discharge with PCP followup  ED Course  MDM Rules/Calculators/A&P Medical Decision Making Problems Addressed: Acute midline low back pain without sciatica: acute illness or injury  Amount and/or Complexity of Data Reviewed Radiology: ordered and independent interpretation performed. Decision-making details documented in ED Course.  Risk Prescription drug management.     Final Clinical Impression(s) / ED Diagnoses Final diagnoses:  Acute midline low back pain without sciatica    Rx / DC Orders ED Discharge Orders          Ordered    naproxen (NAPROSYN) 500 MG tablet  2 times daily PRN        08/25/22 0043    methocarbamol (ROBAXIN) 500 MG tablet  2 times daily        08/25/22 0043             Truddie Hidden, MD 08/25/22 [phone]

## 2022-08-25 NOTE — ED Notes (Signed)
Pt developed back pain yesterday with pain radiating down left leg
